# Patient Record
Sex: Female | Born: 2005 | Race: White | Hispanic: No | Marital: Single | Smoking: Never smoker
Health system: Southern US, Community
[De-identification: ages and names within clinical notes are randomized; demographics above are authoritative.]

## PROBLEM LIST (undated history)

## (undated) DIAGNOSIS — IMO0001 Reserved for inherently not codable concepts without codable children: Secondary | ICD-10-CM

## (undated) DIAGNOSIS — Z464 Encounter for fitting and adjustment of orthodontic device: Secondary | ICD-10-CM

## (undated) DIAGNOSIS — J45909 Unspecified asthma, uncomplicated: Secondary | ICD-10-CM

## (undated) DIAGNOSIS — F909 Attention-deficit hyperactivity disorder, unspecified type: Secondary | ICD-10-CM

## (undated) HISTORY — PX: NO PAST SURGERIES: SHX2092

---
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
## 2025-01-10 ENCOUNTER — Telehealth: Payer: MEDICAID | Admitting: Child and Adolescent Psychiatry

## 2025-01-10 DIAGNOSIS — F4010 Social phobia, unspecified: Secondary | ICD-10-CM | POA: Diagnosis not present

## 2025-01-10 DIAGNOSIS — F902 Attention-deficit hyperactivity disorder, combined type: Secondary | ICD-10-CM

## 2025-01-10 DIAGNOSIS — F411 Generalized anxiety disorder: Secondary | ICD-10-CM

## 2025-01-10 MED ORDER — ESCITALOPRAM OXALATE 10 MG PO TABS: 10.0000 mg | ORAL_TABLET | Freq: Every day | ORAL | 1 refills | Status: AC

## 2025-01-10 MED ORDER — ESCITALOPRAM OXALATE 5 MG PO TABS: 5.0000 mg | ORAL_TABLET | Freq: Every day | ORAL | 1 refills | Status: AC

## 2025-01-10 MED ORDER — AMPHETAMINE-DEXTROAMPHET ER 20 MG PO CP24: 20.0000 mg | ORAL_CAPSULE | Freq: Every day | ORAL | 0 refills | Status: AC

## 2025-01-10 NOTE — Progress Notes (Signed)
 Virtual Visit via Video Note  I connected with Stacey Atkins on 01/10/2025 at 11:30 AM EST by a video enabled telemedicine application and verified that I am speaking with the correct person using two identifiers.  Location: Patient: Home Provider: Office   I discussed the limitations of evaluation and management by telemedicine and the availability of in person appointments. The patient expressed understanding and agreed to proceed.     I discussed the assessment and treatment plan with the patient. The patient was provided an opportunity to ask questions and all were answered. The patient agreed with the plan and demonstrated an understanding of the instructions.   The patient was advised to call back or seek an in-person evaluation if the symptoms worsen or if the condition fails to improve as anticipated.  [name], MD   [facility] MD/PA/NP OP Progress Note  01/10/2025 1:56 PM Stacey Atkins  MRN:  [id number]  Chief Complaint: Medication management follow-up for anxiety and ADHD.SABRA  HPI:   This is a 19 year old female, domiciled with biological parents, 12th grader at [employer], with no significant medical history and psychiatric history significant of ADHD, and anxiety, who was seen for initial evaluation in September 2024, presents with her mother for follow-up.  She was evaluated over in person for med management follow up. She was evaluated alone and jointly with her mother.    In the interim since last appointment, her mother called and reported that she started giving her Adderall XR 20 mg daily due to lack of improvement with her inattentive symptoms of ADHD with 10 mg of Adderall XR daily.  Writer agreed to increase the dose of Adderall XR to 20 mg daily due to improvement with 20 mg.  At her last appointment also her Lexapro  was increased to 15 mg daily because of her anxiety.  Today she tells me that she has been doing better with  her anxiety, her anxiety has been minimal, her mood is around 9 or 10 out of 10, 10 being the best mood, she continues to sleep well, has restful sleep, appetite has been good, denies SI or HI.  She tells me that she tolerated increased dose of Lexapro  well and noted improvement with anxiety on a higher dose of Lexapro .  She says that she has been doing well in school, was accepted at [employer] however they do not offer the program that she is interested and therefore she will be attending [employer] after graduating from high school.  Her mother denies any concerns for today's appointment and tells me that patient has continued to do well, has noted improvement with anxiety and ADHD with medication adjustments.  We discussed to continue with current medications and follow-up in about 3 months or earlier if needed.  Patient provided verbal informed consent to speak with mother during the visit today.  They were also asked to send release of information consent.   Visit Diagnosis:    ICD-10-CM   1. Social anxiety disorder  F40.10     2. Generalized anxiety disorder  F41.1     3. Attention deficit hyperactivity disorder (ADHD), combined type  F90.2           Past Psychiatric History:   Inpatient: None, recent ER visits for panic attack.  RTC: none reported Outpatient: No previous psychiatrist    - Meds: Prozac did not work; taking Zoloft since last 6-7 years. (She was doing find); Concerta  - she is taking since  last 8 years, and twice daily since last three years.     - Therapy: Currently seeing school counselor.  Hx of SI/HI: None reported  Past Medical History:  Past Medical History:  Diagnosis Date   ADHD (attention deficit hyperactivity disorder)    Asthma    Orthodontics    braces    Past Surgical History:  Procedure Laterality Date   ADENOIDECTOMY N/A 03/19/2016   Procedure: ADENOIDECTOMY;  Surgeon: Deward Dolly, MD;  Location: [facility];  Service: ENT;   Laterality: N/A;  DR BENNETT WILL BRING RAST TUBES   MYRINGOTOMY WITH TUBE PLACEMENT Bilateral 03/19/2016   Procedure: MYRINGOTOMY WITH TUBE PLACEMENT;  Surgeon: Deward Dolly, MD;  Location: [facility];  Service: ENT;  Laterality: Bilateral;   TONSILLECTOMY Bilateral 12/04/2021   Procedure: TONSILLECTOMY;  Surgeon: Dolly Deward, MD;  Location: [facility];  Service: ENT;  Laterality: Bilateral;  Latex    Family Psychiatric History:   Father - PTSD from law enforcement  Mother - Depression and currently taking Lexapro  Mother's mother and father - for unclear reasons but has been on zoloft.  Mother's side of the family, father - Anxiety.  No family hx of suicide attempts or SUD.   Family History: No family history on file.  Social History:  Social History   Socioeconomic History   Marital status: Single    Spouse name: Not on file   Number of children: Not on file   Years of education: Not on file   Highest education level: 11th grade  Occupational History   Not on file  Tobacco Use   Smoking status: Never   Smokeless tobacco: Not on file  Vaping Use   Vaping status: Never Used  Substance and Sexual Activity   Alcohol use: No   Drug use: Never   Sexual activity: Never  Other Topics Concern   Not on file  Social History Narrative   ** Merged History Encounter **       Social Drivers of Health   Tobacco Use: Low Risk  (01/07/2025)   Received from [facility]   Patient History    Smoking Tobacco Use: Never    Smokeless Tobacco Use: Never    Passive Exposure: Never  Financial Resource Strain: Low Risk  (07/28/2024)   Received from [facility]   Overall Financial Resource Strain (CARDIA)    Difficulty of Paying Living Expenses: Not very hard  Food Insecurity: No Food Insecurity (07/28/2024)   Received from [facility]   Epic    Within the past 12 months, you worried that your food would run out  before you got the money to buy more.: Never true    Within the past 12 months, the food you bought just didn't last and you didn't have money to get more.: Never true  Transportation Needs: No Transportation Needs (07/28/2024)   Received from [facility]    In the past 12 months, has lack of transportation kept you from medical appointments or from getting medications?: No    Lack of Transportation (Non-Medical): No  Physical Activity: Not on file  Stress: Not on file  Social Connections: Not on file  Depression (PHQ2-9): Low Risk (12/18/2023)   Depression (PHQ2-9)    PHQ-2 Score: 0  Alcohol Screen: Not on file  Housing: Low Risk  (07/28/2024)   Received from [facility]  In the last 12 months, was there a time when you were not able to pay the mortgage or rent on time?: No    In the past 12 months, how many times have you moved where you were living?: 0    At any time in the past 12 months, were you homeless or living in a shelter (including now)?: No  Utilities: Not At Risk (07/28/2024)   Received from [facility]   Epic    In the past 12 months has the electric, gas, oil, or water company threatened to shut off services in your home?: No  Health Literacy: Not on file    Allergies:  Allergies  Allergen Reactions   Concerta  [Methylphenidate  Hcl]     Redness on the face   Latex Hives    Blowing up a balloon caused hives    Metabolic Disorder Labs: No results found for: HGBA1C, MPG No results found for: PROLACTIN No results found for: CHOL, TRIG, HDL, CHOLHDL, VLDL, LDLCALC No results found for: TSH  Therapeutic Level Labs: No results found for: LITHIUM No results found for: VALPROATE No results found for: CBMZ  Current Medications: Current Outpatient Medications  Medication Sig Dispense Refill   Adapalene 0.3 % gel Apply 1 Application topically at  bedtime.     albuterol  (VENTOLIN  HFA) 108 (90 Base) MCG/ACT inhaler Inhale into the lungs every 6 (six) hours as needed for wheezing or shortness of breath.     amphetamine -dextroamphetamine (ADDERALL XR) 20 MG 24 hr capsule Take 1 capsule (20 mg total) by mouth daily. 30 capsule 0   cetirizine (ZYRTEC) 10 MG tablet Take 10 mg by mouth daily.     escitalopram  (LEXAPRO ) 10 MG tablet Take 1 tablet (10 mg total) by mouth daily. 90 tablet 1   escitalopram  (LEXAPRO ) 5 MG tablet Take 1 tablet (5 mg total) by mouth daily. 90 tablet 1   hydrOXYzine  (ATARAX ) 10 MG tablet Take 1 tablet (10 mg total) by mouth 3 (three) times daily as needed. 30 tablet 0   No current facility-administered medications for this visit.     Musculoskeletal: Gait & Station: unable to assess since visit was over the telemedicine. Patient leans: N/A  Psychiatric Specialty Exam: Review of Systems  There were no vitals taken for this visit.There is no height or weight on file to calculate BMI.  General Appearance: Casual and Well Groomed  Eye Contact:  Good  Speech:  Clear and Coherent and Normal Rate  Volume:  Normal  Mood:  good...  Affect:  Appropriate, Congruent, and Full Range  Thought Process:  Goal Directed and Linear  Orientation:  Full (Time, Place, and Person)  Thought Content: Logical   Suicidal Thoughts:  No  Homicidal Thoughts:  No  Memory:  Immediate;   Fair Recent;   Fair Remote;   Fair  Judgement:  Fair  Insight:  Fair  Psychomotor Activity:  Normal  Concentration:  Concentration: Good and Attention Span: Good  Recall:  Good  Fund of Knowledge: Good  Language: Good  Akathisia:  No    AIMS (if indicated): not done  Assets:  Communication Skills Desire for Improvement Financial Resources/Insurance Housing Leisure Time Physical Health Social Support Transportation Vocational/Educational  ADL's:  Intact  Cognition: WNL  Sleep:  Good   Screenings: GAD-7    Advertising Copywriter  from 12/18/2023 in [facility]  Total GAD-7 Score 0   PHQ2-9    Flowsheet Row Counselor from  12/18/2023 in [facility]  PHQ-2 Total Score 0   Flowsheet Row ED from 08/09/2024 in [facility] at [facility] ED from 08/20/2023 in [facility] at [facility] Admission (Discharged) from 12/04/2021 in [facility]  C-SSRS RISK CATEGORY No Risk No Risk No Risk     Assessment and Plan:   19 year old female with ADHD and Anxiety, was cross tapered from Zoloft to Lexapro  due to worsening of anxiety on higher dose of zoloft. At her last appointment Lexapro  was increased to 15 mg daily, and Adderall was increased to 20 mg daily in the interim since last appointment, she appears to have tolerated the increased dose of these medications well and has improvement with her anxiety as well as ADHD symptoms and therefore recommending to continue with medications as mentioned below in the plan. .      Plan:   Continue with Lexapro  15 mg daily  Risks and benefits explained. Side effects including but not limited to nausea, vomiting, diarrhea, constipation, headaches, dizziness, black box warning of suicidal thoughts with SSRI were discussed with pt and parents at the initiation. Mother provided informed consent.     2. Continue with Adderall XR 20 mg daily.   3. Recommended ind. Therapy. Mother was recommended to establish therapy at this clinic.   Collaboration of Care: Collaboration of Care: Other N/A   Consent: Patient/Guardian gives verbal consent for treatment and assignment of benefits for services provided during this visit. Patient/Guardian expressed understanding and agreed to proceed.     [name], MD 01/10/2025, 1:56 PM

## 2025-04-04 ENCOUNTER — Telehealth: Payer: MEDICAID | Admitting: Child and Adolescent Psychiatry
